# Patient Record
Sex: Male | Born: 1980 | Race: White | Hispanic: No | Marital: Single | Smoking: Current every day smoker
Health system: Southern US, Community
[De-identification: ages and names within clinical notes are randomized; demographics above are authoritative.]

## PROBLEM LIST (undated history)

## (undated) DIAGNOSIS — J45909 Unspecified asthma, uncomplicated: Secondary | ICD-10-CM

## (undated) DIAGNOSIS — C679 Malignant neoplasm of bladder, unspecified: Secondary | ICD-10-CM

## (undated) HISTORY — PX: HIP SURGERY: SHX245

---

## 2008-08-25 ENCOUNTER — Inpatient Hospital Stay (HOSPITAL_COMMUNITY): Admission: EM | Admit: 2008-08-25 | Discharge: 2008-09-08 | Payer: Self-pay | Admitting: Emergency Medicine

## 2008-08-31 ENCOUNTER — Ambulatory Visit: Payer: Self-pay | Admitting: Vascular Surgery

## 2008-08-31 ENCOUNTER — Encounter (INDEPENDENT_AMBULATORY_CARE_PROVIDER_SITE_OTHER): Payer: Self-pay | Admitting: General Surgery

## 2008-09-01 ENCOUNTER — Ambulatory Visit: Payer: Self-pay | Admitting: Physical Medicine & Rehabilitation
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
## 2011-09-01 LAB — CROSSMATCH: Antibody Screen: NEGATIVE

## 2011-09-01 LAB — POCT I-STAT 7, (LYTES, BLD GAS, ICA,H+H)
HCT: 22 — ABNORMAL LOW
Hemoglobin: 7.5 — CL
Patient temperature: 37
Potassium: 3.4 — ABNORMAL LOW
TCO2: 34
pO2, Arterial: 130 — ABNORMAL HIGH

## 2011-09-01 LAB — BASIC METABOLIC PANEL
BUN: 4 — ABNORMAL LOW
BUN: 4 — ABNORMAL LOW
BUN: 5 — ABNORMAL LOW
CO2: 30
CO2: 32
Calcium: 7.4 — ABNORMAL LOW
Calcium: 7.6 — ABNORMAL LOW
Calcium: 8.2 — ABNORMAL LOW
Chloride: 93 — ABNORMAL LOW
Creatinine, Ser: 0.71
Creatinine, Ser: 0.87
GFR calc non Af Amer: >60
GFR calc non Af Amer: >60
GFR calc non Af Amer: >60
Glucose, Bld: 105 — ABNORMAL HIGH
Glucose, Bld: 110 — ABNORMAL HIGH
Glucose, Bld: 127 — ABNORMAL HIGH
Glucose, Bld: 144 — ABNORMAL HIGH
Potassium: 3.3 — ABNORMAL LOW
Potassium: 4.2
Sodium: 129 — ABNORMAL LOW
Sodium: 135

## 2011-09-01 LAB — CBC
HCT: 24 — ABNORMAL LOW
HCT: 26.6 — ABNORMAL LOW
HCT: 26.7 — ABNORMAL LOW
HCT: 26.7 — ABNORMAL LOW
HCT: 39.2
Hemoglobin: 13.1
Hemoglobin: 8.2 — ABNORMAL LOW
Hemoglobin: 9.1 — ABNORMAL LOW
Hemoglobin: 9.2 — ABNORMAL LOW
Hemoglobin: 9.3 — ABNORMAL LOW
MCHC: 34.3
MCHC: 34.3
MCHC: 35
MCV: 92.5
MCV: 92.5
Platelets: 196
Platelets: 197
Platelets: 216
Platelets: 279
RBC: 2.83 — ABNORMAL LOW
RBC: 2.85 — ABNORMAL LOW
RBC: 2.89 — ABNORMAL LOW
RDW: 12.9
RDW: 13.2
RDW: 13.3
RDW: 13.9
RDW: 14
RDW: 14.1
WBC: 17.1 — ABNORMAL HIGH
WBC: 6.4
WBC: 8
WBC: 8.8

## 2011-09-01 LAB — POCT I-STAT 3, ART BLOOD GAS (G3+)
Acid-Base Excess: 11 — ABNORMAL HIGH
Bicarbonate: 33.6 — ABNORMAL HIGH
O2 Saturation: 91
O2 Saturation: 96
Patient temperature: 98.4
Patient temperature: 98.4
Patient temperature: 99.8
TCO2: 33
pCO2 arterial: 33.5 — ABNORMAL LOW
pCO2 arterial: 34.6 — ABNORMAL LOW
pCO2 arterial: 35
pCO2 arterial: 35.1
pCO2 arterial: 35.6
pH, Arterial: 7.577 — ABNORMAL HIGH
pH, Arterial: 7.579 — ABNORMAL HIGH
pH, Arterial: 7.58 — ABNORMAL HIGH
pH, Arterial: 7.585 — ABNORMAL HIGH
pH, Arterial: 7.605

## 2011-09-01 LAB — POCT I-STAT EG7
Acid-Base Excess: 7 — ABNORMAL HIGH
Bicarbonate: 33.8 — ABNORMAL HIGH
Calcium, Ion: 1.07 — ABNORMAL LOW
HCT: 31 — ABNORMAL LOW
Hemoglobin: 10.5 — ABNORMAL LOW
Patient temperature: 37
pCO2, Ven: 59.5 — ABNORMAL HIGH
pH, Ven: 7.362 — ABNORMAL HIGH
pO2, Ven: 19 — CL

## 2011-09-01 LAB — URINALYSIS, ROUTINE W REFLEX MICROSCOPIC
Glucose, UA: NEGATIVE
Leukocytes, UA: NEGATIVE
Nitrite: NEGATIVE
Protein, ur: 100 — AB

## 2011-09-01 LAB — TYPE AND SCREEN

## 2011-09-01 LAB — RAPID URINE DRUG SCREEN, HOSP PERFORMED
Amphetamines: NOT DETECTED
Barbiturates: NOT DETECTED
Benzodiazepines: NOT DETECTED
Cocaine: POSITIVE — AB
Opiates: NOT DETECTED
Tetrahydrocannabinol: NOT DETECTED

## 2011-09-01 LAB — POCT I-STAT, CHEM 8
BUN: 5 — ABNORMAL LOW
Creatinine, Ser: 1
Hemoglobin: 13.6
Potassium: 2.9 — ABNORMAL LOW
Sodium: 137

## 2011-09-01 LAB — URINE MICROSCOPIC-ADD ON

## 2011-09-01 LAB — ABO/RH: ABO/RH(D): A POS

## 2011-09-01 LAB — HEMOGLOBIN A1C
Hgb A1c MFr Bld: 5.3
Mean Plasma Glucose: 105

## 2011-09-01 LAB — GLUCOSE, CAPILLARY: Glucose-Capillary: 109 — ABNORMAL HIGH

## 2011-09-01 LAB — PROTIME-INR: INR: 1.1

## 2011-09-01 LAB — PREPARE RBC (CROSSMATCH)

## 2011-09-01 LAB — ETHANOL: Alcohol, Ethyl (B): 146 — ABNORMAL HIGH

## 2011-09-02 LAB — TRIGLYCERIDES
Triglycerides: 131
Triglycerides: 140

## 2011-09-02 LAB — BASIC METABOLIC PANEL
BUN: 7
Calcium: 8.3 — ABNORMAL LOW
Chloride: 98
Creatinine, Ser: 0.64
GFR calc non Af Amer: >60

## 2011-09-02 LAB — COMPREHENSIVE METABOLIC PANEL
ALT: 32
AST: 36
Albumin: 1.9 — ABNORMAL LOW
Alkaline Phosphatase: 57
CO2: 29
Calcium: 7.2 — ABNORMAL LOW
Chloride: 94 — ABNORMAL LOW
Chloride: 96
Creatinine, Ser: 0.7
GFR calc Af Amer: >60
GFR calc non Af Amer: >60
Glucose, Bld: 90
Potassium: 3.6
Sodium: 131 — ABNORMAL LOW
Total Bilirubin: 1.4 — ABNORMAL HIGH
Total Bilirubin: 1.6 — ABNORMAL HIGH
Total Protein: 4.7 — ABNORMAL LOW

## 2011-09-02 LAB — CULTURE, RESPIRATORY W GRAM STAIN: Culture: NORMAL

## 2011-09-02 LAB — GLUCOSE, CAPILLARY
Glucose-Capillary: 108 — ABNORMAL HIGH
Glucose-Capillary: 116 — ABNORMAL HIGH
Glucose-Capillary: 90
Glucose-Capillary: 92
Glucose-Capillary: 94

## 2011-09-02 LAB — DIFFERENTIAL
Basophils Absolute: 0
Basophils Relative: 0
Eosinophils Absolute: 0.3
Eosinophils Relative: 5
Monocytes Absolute: 0.6
Monocytes Relative: 10

## 2011-09-02 LAB — CULTURE, BLOOD (ROUTINE X 2): Culture: NO GROWTH

## 2011-09-02 LAB — CBC
HCT: 25.8 — ABNORMAL LOW
HCT: 26.5 — ABNORMAL LOW
Hemoglobin: 8.9 — ABNORMAL LOW
Hemoglobin: 9 — ABNORMAL LOW
MCHC: 34.5
MCV: 93.8
Platelets: 281
RBC: 2.74 — ABNORMAL LOW
RDW: 14
WBC: 5.7
WBC: 7.1

## 2011-09-02 LAB — POTASSIUM: Potassium: 5.2 — ABNORMAL HIGH

## 2011-09-02 LAB — URINE CULTURE
Colony Count: NO GROWTH
Culture: NO GROWTH

## 2011-09-02 LAB — PROTIME-INR: Prothrombin Time: 16.1 — ABNORMAL HIGH

## 2016-06-29 ENCOUNTER — Encounter (HOSPITAL_COMMUNITY): Payer: Self-pay | Admitting: General Practice

## 2016-06-29 ENCOUNTER — Inpatient Hospital Stay (HOSPITAL_COMMUNITY)
Admission: AD | Admit: 2016-06-29 | Discharge: 2016-07-01 | DRG: 885 | Disposition: A | Discharge status: Home / Self Care | Payer: Federal, State, Local not specified - Other | Attending: Psychiatry | Admitting: Psychiatry

## 2016-06-29 DIAGNOSIS — F332 Major depressive disorder, recurrent severe without psychotic features: Secondary | ICD-10-CM

## 2016-06-29 DIAGNOSIS — F339 Major depressive disorder, recurrent, unspecified: Principal | ICD-10-CM | POA: Diagnosis present

## 2016-06-29 DIAGNOSIS — J45909 Unspecified asthma, uncomplicated: Secondary | ICD-10-CM | POA: Diagnosis present

## 2016-06-29 DIAGNOSIS — G4700 Insomnia, unspecified: Secondary | ICD-10-CM | POA: Diagnosis present

## 2016-06-29 DIAGNOSIS — F411 Generalized anxiety disorder: Secondary | ICD-10-CM | POA: Diagnosis present

## 2016-06-29 DIAGNOSIS — F1410 Cocaine abuse, uncomplicated: Secondary | ICD-10-CM | POA: Diagnosis present

## 2016-06-29 DIAGNOSIS — F17210 Nicotine dependence, cigarettes, uncomplicated: Secondary | ICD-10-CM | POA: Diagnosis present

## 2016-06-29 DIAGNOSIS — F410 Panic disorder [episodic paroxysmal anxiety] without agoraphobia: Secondary | ICD-10-CM | POA: Diagnosis present

## 2016-06-29 DIAGNOSIS — F4310 Post-traumatic stress disorder, unspecified: Secondary | ICD-10-CM | POA: Diagnosis present

## 2016-06-29 DIAGNOSIS — F1010 Alcohol abuse, uncomplicated: Secondary | ICD-10-CM | POA: Diagnosis present

## 2016-06-29 DIAGNOSIS — R45851 Suicidal ideations: Secondary | ICD-10-CM | POA: Diagnosis present

## 2016-06-29 HISTORY — DX: Unspecified asthma, uncomplicated: J45.909

## 2016-06-29 MED ORDER — VITAMIN B-1 100 MG PO TABS
100.0000 mg | ORAL_TABLET | Freq: Every day | ORAL | Status: DC
  Administered 2016-06-30 – 2016-07-01 (×2): 100 mg via ORAL
  Filled 2016-06-29 (×4): qty 1

## 2016-06-29 MED ORDER — LORAZEPAM 1 MG PO TABS
1.0000 mg | ORAL_TABLET | Freq: Three times a day (TID) | ORAL | Status: DC
  Administered 2016-07-01 (×2): 1 mg via ORAL
  Filled 2016-06-29 (×2): qty 1

## 2016-06-29 MED ORDER — HYDROXYZINE HCL 25 MG PO TABS
25.0000 mg | ORAL_TABLET | Freq: Four times a day (QID) | ORAL | Status: DC | PRN
Start: 2016-06-29 — End: 2016-07-01

## 2016-06-29 MED ORDER — GABAPENTIN 600 MG PO TABS
300.0000 mg | ORAL_TABLET | Freq: Two times a day (BID) | ORAL | Status: DC
  Administered 2016-06-29: 300 mg via ORAL
  Filled 2016-06-29: qty 0.5
  Filled 2016-06-29: qty 1
  Filled 2016-06-29 (×3): qty 0.5

## 2016-06-29 MED ORDER — LOPERAMIDE HCL 2 MG PO CAPS: 2.0000 mg | ORAL_CAPSULE | ORAL | Status: DC | PRN

## 2016-06-29 MED ORDER — TRAZODONE HCL 50 MG PO TABS
50.0000 mg | ORAL_TABLET | Freq: Every evening | ORAL | Status: DC | PRN
  Administered 2016-06-29 – 2016-06-30 (×3): 50 mg via ORAL
  Filled 2016-06-29 (×7): qty 1

## 2016-06-29 MED ORDER — LORAZEPAM 1 MG PO TABS
1.0000 mg | ORAL_TABLET | Freq: Four times a day (QID) | ORAL | Status: DC | PRN
Start: 2016-06-29 — End: 2016-07-01

## 2016-06-29 MED ORDER — ENSURE ENLIVE PO LIQD
237.0000 mL | Freq: Three times a day (TID) | ORAL | Status: DC
  Administered 2016-06-29: 237 mL via ORAL

## 2016-06-29 MED ORDER — ACETAMINOPHEN 325 MG PO TABS: 650.0000 mg | ORAL_TABLET | Freq: Four times a day (QID) | ORAL | Status: DC | PRN

## 2016-06-29 MED ORDER — LORAZEPAM 1 MG PO TABS: 1.0000 mg | ORAL_TABLET | Freq: Two times a day (BID) | ORAL | Status: DC

## 2016-06-29 MED ORDER — ALUM & MAG HYDROXIDE-SIMETH 200-200-20 MG/5ML PO SUSP: 30.0000 mL | ORAL | Status: DC | PRN

## 2016-06-29 MED ORDER — MAGNESIUM HYDROXIDE 400 MG/5ML PO SUSP: 30.0000 mL | Freq: Every day | ORAL | Status: DC | PRN

## 2016-06-29 MED ORDER — LORAZEPAM 1 MG PO TABS: 1.0000 mg | ORAL_TABLET | Freq: Every day | ORAL | Status: DC

## 2016-06-29 MED ORDER — LORAZEPAM 1 MG PO TABS
1.0000 mg | ORAL_TABLET | Freq: Four times a day (QID) | ORAL | Status: AC
  Administered 2016-06-29 – 2016-06-30 (×6): 1 mg via ORAL
  Filled 2016-06-29 (×6): qty 1

## 2016-06-29 MED ORDER — ONDANSETRON 4 MG PO TBDP: 4.0000 mg | ORAL_TABLET | Freq: Four times a day (QID) | ORAL | Status: DC | PRN

## 2016-06-29 MED ORDER — NICOTINE 21 MG/24HR TD PT24
21.0000 mg | MEDICATED_PATCH | Freq: Every day | TRANSDERMAL | Status: DC
Start: 2016-06-29 — End: 2016-07-01
  Administered 2016-06-29 – 2016-07-01 (×3): 21 mg via TRANSDERMAL
  Filled 2016-06-29 (×4): qty 1

## 2016-06-29 MED ORDER — THIAMINE HCL 100 MG/ML IJ SOLN: 100.0000 mg | Freq: Once | INTRAMUSCULAR | Status: DC

## 2016-06-29 MED ORDER — ADULT MULTIVITAMIN W/MINERALS CH
1.0000 | ORAL_TABLET | Freq: Every day | ORAL | Status: DC
  Administered 2016-06-29 – 2016-07-01 (×3): 1 via ORAL
  Filled 2016-06-29 (×5): qty 1
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
## 2016-06-29 NOTE — Progress Notes (Signed)
Patient did attend the evening speaker AA meeting.  

## 2016-06-29 NOTE — Tx Team (Signed)
Initial Interdisciplinary Treatment Plan   PATIENT STRESSORS: Financial difficulties Loss of Mother in May Occupational concerns Substance abuse   PATIENT STRENGTHS: Average or above average intelligence General fund of knowledge   PROBLEM LIST: Problem List/Patient Goals Date to be addressed Date deferred Reason deferred Estimated date of resolution  Risk for Suicide 13-Jul-2016           ETOH Abuse Jul 13, 2016           "Attend groups" Jul 13, 2016           "Learning other strategies instead of drinking" 07/13/2016           Grief from mother's death 2016/07/13      DISCHARGE CRITERIA:  Improved stabilization in mood, thinking, and/or behavior Verbal commitment to aftercare and medication compliance Withdrawal symptoms are absent or subacute and managed without 24-hour nursing intervention  PRELIMINARY DISCHARGE PLAN: Attend 12-step recovery group Outpatient therapy  PATIENT/FAMIILY INVOLVEMENT: This treatment plan has been presented to and reviewed with the patient, Bobby Herman.  The patient and family have been given the opportunity to ask questions and make suggestions.  [name] 07/13/2016, 11:25 AM

## 2016-06-29 NOTE — BHH Group Notes (Signed)
[facility] Group Notes: (Clinical Social Work)   06/29/2016      Type of Therapy:  Group Therapy   Participation Level:  Did Not Attend    Ambrose Mantle, LCSW 06/29/2016, 1:52 PM
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
## 2016-06-30 MED ORDER — SERTRALINE HCL 25 MG PO TABS
25.0000 mg | ORAL_TABLET | Freq: Every day | ORAL | Status: DC
  Administered 2016-06-30 – 2016-07-01 (×2): 25 mg via ORAL
  Filled 2016-06-30 (×4): qty 1

## 2016-06-30 MED ORDER — TRIAMCINOLONE ACETONIDE 0.5 % EX CREA
TOPICAL_CREAM | Freq: Three times a day (TID) | CUTANEOUS | Status: DC
  Administered 2016-06-30 – 2016-07-01 (×3): via TOPICAL
  Filled 2016-06-30: qty 15

## 2016-06-30 MED ORDER — GABAPENTIN 300 MG PO CAPS
300.0000 mg | ORAL_CAPSULE | Freq: Two times a day (BID) | ORAL | Status: DC
  Administered 2016-06-30 – 2016-07-01 (×3): 300 mg via ORAL
  Filled 2016-06-30 (×5): qty 1

## 2016-06-30 NOTE — Progress Notes (Signed)
D: Patient pleasant and cooperative with care this shift. Pt with bright affect on approach and is noted to interact well with peers in the milieu. Pt participated in evening group session. A: Q 15 minute safety checks, encourage group participation and med compliance. R: Pt denies SI/HI or plans to harm himself.

## 2016-06-30 NOTE — Progress Notes (Signed)
Recreation Therapy Notes  Date: 06/30/16 Time: 0930 Location: 300 Hall Group Room  Group Topic: Stress Management  Goal Area(s) Addresses:  Patient will verbalize importance of using healthy stress management.  Patient will identify positive emotions associated with healthy stress management.    Intervention: Stress Management  Activity :  Healthy Boundaries Guided Imagery.  LRT introduced the technique of guided imagery to the patients.  Pt were to follow along as the LRT read the script to engaged in the activity.    Education:  Stress Management, Discharge Planning.    Clinical Observations/Feedback: Pt did not attend group.   Kacy Hegna, LRT/CTRS  

## 2016-06-30 NOTE — Progress Notes (Signed)
[facility] Group Notes:  (Nursing/MHT/Case Management/Adjunct)  Date:  06/30/2016  Time:  11:52 PM  Type of Therapy:  Psychoeducational Skills  Participation Level:  Active  Participation Quality:  Intrusive and Monopolizing  Affect:  Flat  Cognitive:  Appropriate  Insight:  Good  Engagement in Group:  Distracting  Modes of Intervention:  Education  Summary of Progress/Problems: The patient had to be redirected for talking out of turn but answered all of the questions. He indicated that he was simply grateful to be alive and that he had a good talk with his parents. In terms of the theme for the day, his wellness strategy will be to attend A.A. Meetings.   Hazle Coca S 06/30/2016, 11:52 PM

## 2016-06-30 NOTE — [facility] Group Notes (Signed)
[facility] LCSW Group Therapy  06/30/2016 4:34 PM  Type of Therapy:  Group Therapy  Participation Level:  Active  Participation Quality:  Attentive   Affect:  Appropriate  Cognitive:  Alert and Oriented  Insight:  Improving  Engagement in Therapy:  Improving  Modes of Intervention:  Discussion, Education, Exploration, Problem-solving, Rapport Building, Socialization and Support  Summary of Progress/Problems: Today's Topic: Overcoming Obstacles. Patients identified one short term goal and potential obstacles in reaching this goal. Patients processed barriers involved in overcoming these obstacles. Patients identified steps necessary for overcoming these obstacles and explored motivation (internal and external) for facing these difficulties head on. Bobby Herman was attentive and engaged during today's processing group. He talked about alcoholism and how it has negatively impacted his life from 5 DUI's to 3 years jail time. Patient reports that he is working on abstaining from drinking and plans to return home and attend AA. "I think I am gonna be more serious about taking my medications too, which is a first for me."   Smart, Jahanna Raether LCSW 06/30/2016, 4:34 PM

## 2016-06-30 NOTE — Progress Notes (Signed)
NUTRITION ASSESSMENT  Pt identified as at risk on the Malnutrition Screen Tool  INTERVENTION: 1. Supplements: Continue Ensure Enlive po BID, each supplement provides 350 kcal and 20 grams of protein  NUTRITION DIAGNOSIS: Unintentional weight loss related to sub-optimal intake as evidenced by pt report.   Goal: Pt to meet >/= 90% of their estimated nutrition needs.  Monitor:  PO intake  Assessment:  Pt admitted with depression, SI and substance abuse. Pt with history of cocaine and ETOH abuse. Suspect poor quality diet PTA d/t substance abuse. Pt reports 30 lb weight loss in the last 6 months, 14% weight loss x 6 months, significant for time frame. Pt has been ordered Ensure supplements, will continue.   Height: Ht Readings from Last 1 Encounters:  06/29/16 5\' 11"  (1.803 m)    Weight: Wt Readings from Last 1 Encounters:  06/29/16 178 lb (80.7 kg)    Weight Hx: Wt Readings from Last 10 Encounters:  06/29/16 178 lb (80.7 kg)    BMI:  Body mass index is 24.83 kg/m. Pt meets criteria for normal based on current BMI.  Estimated Nutritional Needs: Kcal: 25-30 kcal/kg Protein: > 1 gram protein/kg Fluid: 1 ml/kcal  Diet Order: Diet regular Room service appropriate? Yes; Fluid consistency: Thin Pt is also offered choice of unit snacks mid-morning and mid-afternoon.  Pt is eating as desired.   Lab results and medications reviewed.   Tilda Franco, MS, RD, LDN Pager: [phone] After Hours Pager: [phone]
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
## 2016-06-30 NOTE — Plan of Care (Signed)
Problem: Coping: Goal: Ability to cope will improve Outcome: Progressing Pt interacting well with peers and participated in evening AA session. Pt complaint with HS medications.

## 2016-06-30 NOTE — Progress Notes (Signed)
D: Patient up and visible in the milieu. Spoke with patient 1:1. Rates sleep fair, appetite and concentration as good, energy as normal. Patient's affect is animated, mood anxious. Rating his depression at a 6/10, hopelessness at a 3/10 and anxiety at a 4/10. Patient appears to be minimizing events stating, "I was drunk but I'm fine now. I need to get back to work. 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   A: Medicated per orders, no prns requested or required. Educated patient on the effects of alcohol intake, especially to the extent to which he drinks, on the body. Explained ativan protocol and what he can expect going forward. Also offered Vol Consent for Treatment form per Dr. Elna Breslow which patient signed. Emotional support offered and self inventory reviewed. High fall risk precautions reviewed and in place.   R: Patient verbalizes understanding, calmer after processing. Patient denies SI/HI and remains safe on level III obs.
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
## 2016-07-01 MED ORDER — TRIAMCINOLONE ACETONIDE 0.5 % EX CREA: TOPICAL_CREAM | Freq: Three times a day (TID) | CUTANEOUS | 0 refills | Status: AC

## 2016-07-01 MED ORDER — GABAPENTIN 300 MG PO CAPS: 300.0000 mg | ORAL_CAPSULE | Freq: Two times a day (BID) | ORAL | 0 refills | Status: AC

## 2016-07-01 MED ORDER — HYDROXYZINE HCL 25 MG PO TABS: ORAL_TABLET | ORAL | 0 refills | Status: AC

## 2016-07-01 MED ORDER — HYDROXYZINE HCL 25 MG PO TABS
25.0000 mg | ORAL_TABLET | Freq: Three times a day (TID) | ORAL | Status: DC
  Filled 2016-07-01 (×3): qty 21

## 2016-07-01 MED ORDER — NICOTINE 21 MG/24HR TD PT24: 21.0000 mg | MEDICATED_PATCH | Freq: Every day | TRANSDERMAL | 0 refills | Status: AC

## 2016-07-01 MED ORDER — TRAZODONE HCL 50 MG PO TABS: 50.0000 mg | ORAL_TABLET | Freq: Every evening | ORAL | 0 refills | Status: AC | PRN

## 2016-07-01 MED ORDER — SERTRALINE HCL 25 MG PO TABS
25.0000 mg | ORAL_TABLET | Freq: Every day | ORAL | 0 refills | Status: AC
Start: 2016-07-01 — End: ?

## 2016-07-01 NOTE — Progress Notes (Signed)
Pt has been up and visible in the milieu all evening.  He spent the evening in the dayroom talking to a male peer and watching TV.  He reports that he is doing well with his medications.  He states that his withdrawal symptoms are minimal.  He voices no needs or concerns at this time.  He wants to discharge soon as he says he needs to get back to work so that he does not lose his job.  Pt makes his needs known to staff.  He denies SI/HI/AVH.  Support and encouragement offered.  He is polite and cooperative.  Discharge plans are in process.  Safety maintained with q15 minute checks.

## 2016-07-01 NOTE — Progress Notes (Signed)
  [facility] Adult Case Management Discharge Plan :  Will you be returning to the same living situation after discharge:  Yes,  home with family At discharge, do you have transportation home?: Yes,  pt's sister coming at 1pm Do you have the ability to pay for your medications: Yes,  mental health  Release of information consent forms completed and submitted to medical records by CSW.  Patient to Follow up at: Follow-up Information    [facility] Follow up on 07/03/2016.   Why:  Appt on this date at 9:45AM for assessment of services (medication management/counseling/substance abuse). Please bring: Photo ID, Social Security Card if you have it, proof of income/paycheck stub. Thank you.  Contact information: [address] [place], [place] [address] Phone: [phone] Fax: [phone]        Comprehensive AA list for [place] and [place] counties also provided. Mental Health Association pamphlet provided--for additional community support/resources.   Next level of care provider has access to [facility] Link:no  Safety Planning and Suicide Prevention discussed: Yes,  SPE completed with pt's sister. SPI pamphlet and Mobile Crisis information provided to pt and he was encouraged to share with his support network.   Have you used any form of tobacco in the last 30 days? (Cigarettes, Smokeless Tobacco, Cigars, and/or Pipes): Yes  Has patient been referred to the Quitline?: Patient refused referral  Patient has been referred for addiction treatment: Yes  Smart, Kasee Hantz LCSW 07/01/2016, 11:04 AM

## 2016-07-01 NOTE — Progress Notes (Signed)
Adult Psychoeducational Group Note  Date:  07/01/2016 Time:  0845  Group Topic/Focus:  Orientation:   The focus of this group is to educate the patient on the purpose and policies of crisis stabilization and provide a format to answer questions about their admission.  The group details unit policies and expectations of patients while admitted.   Participation Level:  Active  Participation Quality:  Appropriate  Affect:  Appropriate  Cognitive:  Appropriate  Insight: Appropriate  Engagement in Group:  Engaged  Modes of Intervention:  Discussion and Orientation  Additional Comments:   Damara Klunder L 07/01/2016, 9:29 AM
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
## 2016-07-01 NOTE — Discharge Summary (Signed)
Physician Discharge Summary Note  Patient:  Bobby Herman is an 35 y.o., male MRN:  [id number] DOB:  Nov 05, 1981 Patient phone:  [phone] (home)  Patient address:   [address] [place] [address],  Total Time spent with patient: Greater than 30 minutes  Date of Admission:  06/29/2016 Date of Discharge: 07-01-16  Reason for Admission: Suicidal threats.  Principal Problem: Major depressive disorder, recurrent episode [facility])  Discharge Diagnoses: Patient Active Problem List   Diagnosis Date Noted  . Alcohol abuse, continuous drinking behavior [F10.10] 06/29/2016  . Cocaine abuse, continuous use [F14.10] 06/29/2016  . Major depressive disorder, recurrent episode (HCC) [F33.9] 06/29/2016  . MDD (major depressive disorder), recurrent episode (HCC) [F33.9] 06/29/2016   Past Psychiatric History: Hx. Alcoholism, chronic, Cocaine dependence, MDD.  Past Medical History:  Past Medical History:  Diagnosis Date  . Asthma     Past Surgical History:  Procedure Laterality Date  . HIP SURGERY     Family History: History reviewed. No pertinent family history.  Family Psychiatric  History: See H&P  Social History:  History  Alcohol Use  . Yes    Comment: several 40 oz, "I don't know how much"     History  Drug Use  . Types: Marijuana    Social History   Social History  . Marital status: Single    Spouse name: N/A  . Number of children: N/A  . Years of education: N/A   Social History Main Topics  . Smoking status: Current Every Day Smoker    Packs/day: 1.00    Types: Cigarettes  . Smokeless tobacco: Never Used  . Alcohol use Yes     Comment: several 40 oz, "I don't know how much"  . Drug use:     Types: Marijuana  . Sexual activity: Yes    Birth control/ protection: None   Other Topics Concern  . None   Social History Narrative  . None   Hospital Course: Bobby Herman an 35 y.o.malepresenting to [facility] ED via law enforcement requesting Detox.  Upon arrival patient's ETOH level was at 27. Medical Documentation reports that patient made comments stating that he wants to drink himself "to death", patient has an increase in anxiety and unclear thinking. Patient reports that he has a long history of drinking since the age of 71, however reports that in the past year he has been drinking daily. Patient reports upon arrival to the hospital he was very upset and states that while he was under the influence of alcohol he stated that he wanted to "drink" himself to death.   [name] was admitted to the hospital with a BAL of 27 per toxicology tests results. He admits having been drinking a lot & it has worsened resulting in high anxiety levels & inability to think clearly. Reports indicated that he was making suicidal threats that he wanted to drink himself to death. He was here for alcohol detox & mood stabilization treatments. His detoxification treatment was achieved using Ativan detox regimen on a tapering dose format. He was enrolled in the group counseling sessions, AA/NA meetings being offered and held on this unit. He participated and learned coping skills. He tolerated his treatment regimen without any significant adverse effects and or reactions.  Besides the detoxification treatments, [name] was also medicated & discharged on; Trazodone 50 mg for insomnia, Gabapentin 300 mg for agitation, Sertraline 25 mg for depression, Hydroxyzine 25 mg prn for anxiety & Nicotine patch for smoking cessation. [name]  has completed detox treatment and his mood is stable. This is evidenced by his reports of improved mood and absence of substance withdrawal symptoms. He will resume psychiatric care and routine medication management as noted below. He was encouraged to join/attend AA/NA meetings being offered and held within his community to achieve & maintain maximum sobriety.   Upon discharge, Bobby Herman adamantly denies any suicidal, homicidal ideations, auditory, visual  hallucinations, delusional thoughts, paranoia & or substance withdrawal symptoms. He left [facility] with all personal belongings in no apparent distress. He received a 7 days worth supply samples of his [name] discharge medications provided by [facility] pharmacy. Transportation per sister.  Physical Findings: AIMS: Facial and Oral Movements Muscles of Facial Expression: None, normal Lips and Perioral Area: None, normal Jaw: None, normal Tongue: None, normal,Extremity Movements Upper (arms, wrists, hands, fingers): None, normal Lower (legs, knees, ankles, toes): None, normal, Trunk Movements Neck, shoulders, hips: None, normal, Overall Severity Severity of abnormal movements (highest score from questions above): None, normal Incapacitation due to abnormal movements: None, normal Patient's awareness of abnormal movements (rate only patient's report): No Awareness, Dental Status Current problems with teeth and/or dentures?: No Does patient usually wear dentures?: No  CIWA:  CIWA-Ar Total: 0 COWS:     Musculoskeletal: Strength & Muscle Tone: within normal limits Gait & Station: normal Patient leans: N/A  Psychiatric Specialty Exam: Physical Exam  Constitutional: He appears well-developed.  HENT:  Head: Normocephalic.  Eyes: Pupils are equal, round, and reactive to light.  Neck: Normal range of motion.  Cardiovascular: Normal rate.   Respiratory: Effort normal.  GI: Soft.  Genitourinary:  Genitourinary Comments: Denies any issues in this area  Musculoskeletal: Normal range of motion.  Neurological: He is alert.  Skin: Skin is warm.    Review of Systems  Constitutional: Negative.   HENT: Negative.   Eyes: Negative.   Respiratory: Negative.   Cardiovascular: Negative.   Gastrointestinal: Negative.   Genitourinary: Negative.   Musculoskeletal: Negative.   Skin: Negative.   Neurological: Negative.   Endo/Heme/Allergies: Negative.   Psychiatric/Behavioral: Positive for depression (Stable)  and substance abuse (Polysubstance dependence). Negative for hallucinations, memory loss and suicidal ideas. The patient has insomnia (Stable). The patient is not nervous/anxious.     Blood pressure 111/83, pulse (!) 106, temperature 99.5 F (37.5 C), temperature source Oral, resp. rate 16, height 5\' 11"  (1.803 m), weight 80.7 kg (178 lb), SpO2 99 %.Body mass index is 24.83 kg/m.  See Md's SRA   Have you used any form of tobacco in the last 30 days? (Cigarettes, Smokeless Tobacco, Cigars, and/or Pipes): Yes  Has this patient used any form of tobacco in the last 30 days? (Cigarettes, Smokeless Tobacco, Cigars, and/or Pipes): Yes, provided with nicotine patch prescription.  Blood Alcohol level:  Lab Results  Component Value Date   ETH (H) 08/25/2008    146        LOWEST DETECTABLE LIMIT FOR SERUM ALCOHOL IS 11 mg/dL FOR MEDICAL PURPOSES ONLY   Metabolic Disorder Labs:  Lab Results  Component Value Date   HGBA1C  08/30/2008    5.3 (NOTE)   The ADA recommends the following therapeutic goal for glycemic   control related to Hgb A1C measurement:   Goal of Therapy:   < 7.0% Hgb A1C   Reference: American Diabetes Association: Clinical Practice   Recommendations 2008, Diabetes Care,  2008, 31:(Suppl 1).   MPG 105 08/30/2008   No results found for: PROLACTIN Lab Results  Component Value Date  CHOL  08/30/2008    95        ATP III CLASSIFICATION:  <200     mg/dL   Desirable  962-952  mg/dL   Borderline High  >=841    mg/dL   High   TRIG 324 40/08/2724   See Psychiatric Specialty Exam and Suicide Risk Assessment completed by Attending Physician prior to discharge.  Discharge destination:  Home  Is patient on multiple antipsychotic therapies at discharge:  No   Has Patient had three or more failed trials of antipsychotic monotherapy by history:  No  Recommended Plan for Multiple Antipsychotic Therapies: NA    Medication List    TAKE these medications     Indication   gabapentin 300 MG capsule Commonly known as:  NEURONTIN Take 1 capsule (300 mg total) by mouth 2 (two) times daily. For agitation/substance withdrawal syndrome.  Indication:  Agitation, Alcohol Withdrawal Syndrome   hydrOXYzine 25 MG tablet Commonly known as:  ATARAX/VISTARIL Take 1 tablet (25 mg) three times daily: For anxiety  Indication:  Anxiety Neurosis   nicotine 21 mg/24hr patch Commonly known as:  NICODERM CQ - dosed in mg/24 hours Place 1 patch (21 mg total) onto the skin daily. For smoking cessation  Indication:  Nicotine Addiction   sertraline 25 MG tablet Commonly known as:  ZOLOFT Take 1 tablet (25 mg total) by mouth daily. For depression  Indication:  Major Depressive Disorder   traZODone 50 MG tablet Commonly known as:  DESYREL Take 1 tablet (50 mg total) by mouth at bedtime and may repeat dose one time if needed. For sleep  Indication:  Trouble Sleeping   triamcinolone cream 0.5 % Commonly known as:  KENALOG Apply topically 3 (three) times daily. For Psoraisis.  Indication:  Psoriasis      Follow-up Information    [facility] Follow up on 07/03/2016.   Why:  Appt on this date at 9:45AM for assessment of services (medication management/counseling/substance abuse). Please bring: Photo ID, Social Security Card if you have it, proof of income/paycheck stub. Thank you.  Contact information: [address] [place], [place] [address] Phone: [phone] Fax: [phone]         Follow-up recommendations: Activity:  As tolerated Diet: As recommended by your primary care doctor. Keep all scheduled follow-up appointments as recommended.   Comments: Patient is instructed prior to discharge to: Take all medications as prescribed by his/her mental healthcare provider. Report any adverse effects and or reactions from the medicines to his/her outpatient provider promptly. Patient has been instructed & cautioned: To not engage in alcohol and or illegal drug use  while on prescription medicines. In the event of worsening symptoms, patient is instructed to call the crisis hotline, 911 and or go to the nearest ED for appropriate evaluation and treatment of symptoms. To follow-up with his/her primary care provider for your other medical issues, concerns and or health care needs.   Signed: Sanjuana Kava, NP, PMHNP, FNP-BC 07/01/2016, 10:34 AM

## 2016-07-01 NOTE — [facility] Suicide Risk Assessment (Signed)
[facility] Discharge Suicide Risk Assessment   Principal Problem: Major depressive disorder, recurrent episode [facility]) Discharge Diagnoses:  Patient Active Problem List   Diagnosis Date Noted  . Alcohol abuse, continuous drinking behavior [F10.10] 06/29/2016  . Cocaine abuse, continuous use [F14.10] 06/29/2016  . Major depressive disorder, recurrent episode (HCC) [F33.9] 06/29/2016  . MDD (major depressive disorder), recurrent episode (HCC) [F33.9] 06/29/2016    Total Time spent with patient: 30 minutes  Musculoskeletal: Strength & Muscle Tone: within normal limits Gait & Station: normal Patient leans: N/A  Psychiatric Specialty Exam: Review of Systems  Psychiatric/Behavioral: Positive for substance abuse. Negative for depression and suicidal ideas.  All other systems reviewed and are negative.   Blood pressure 111/83, pulse (!) 106, temperature 99.5 F (37.5 C), temperature source Oral, resp. rate 16, height 5\' 11"  (1.803 m), weight 80.7 kg (178 lb), SpO2 99 %.Body mass index is 24.83 kg/m.  General Appearance: Casual  Eye Contact::  Fair  Speech:  Clear and Coherent409  Volume:  Normal  Mood:  Euthymic  Affect:  Appropriate  Thought Process:  Goal Directed and Descriptions of Associations: Intact  Orientation:  Full (Time, Place, and Person)  Thought Content:  WDL  Suicidal Thoughts:  No  Homicidal Thoughts:  No  Memory:  Immediate;   Fair Recent;   Fair Remote;   Fair  Judgement:  Fair  Insight:  Fair  Psychomotor Activity:  Normal  Concentration:  Fair  Recall:  [employer] of Knowledge:Fair  Language: Fair  Akathisia:  No  Handed:  Right  AIMS (if indicated):     Assets:  Desire for Improvement Social Support  Sleep:  Number of Hours: 6.75  Cognition: WNL  ADL's:  Intact   Mental Status Per Nursing Assessment::   On Admission:  Suicidal ideation indicated by patient  Demographic Factors:  Male and Caucasian  Loss Factors: NA  Historical  Factors: Impulsivity  Risk Reduction Factors:   Living with another person, especially a relative, Positive social support and Positive therapeutic relationship  Continued Clinical Symptoms:  Alcohol/Substance Abuse/Dependencies  Cognitive Features That Contribute To Risk:  None    Suicide Risk:  Minimal: No identifiable suicidal ideation.  Patients presenting with no risk factors but with morbid ruminations; may be classified as minimal risk based on the severity of the depressive symptoms  Follow-up Information    [facility] Follow up on 07/03/2016.   Why:  Appt on this date at 9:45AM for assessment of services (medication management/counseling/substance abuse). Please bring: Photo ID, Social Security Card if you have it, proof of income/paycheck stub. Thank you.  Contact information: [address] [place], [place] [address] Phone: [phone] Fax: [phone]          Plan Of Care/Follow-up recommendations:  Activity:  no restrictions Diet:  regular Tests:  as needed Other:  follow up with aftercare  Pauline Trainer, MD 07/01/2016, 9:45 AM

## 2016-07-01 NOTE — Progress Notes (Signed)
Patient discharged per physician order; patient denies SI/HI and A/V hallucinations; patient received prescriptions, samples, AVS, transition record, suicide risk assessment note, and transition record given to the patient after it was reviewed; patient had no other questions or concerns at this time; patient verbalized and signed that all belongings were returned; patient left the unit ambulatory

## 2016-07-01 NOTE — [facility] Suicide Risk Assessment (Signed)
[facility] INPATIENT:  Family/Significant Other Suicide Prevention Education  Suicide Prevention Education:  Education Completed; Aldona Bar (pt's sister) [phone] has been identified by the patient as the family member/significant other with whom the patient will be residing, and identified as the person(s) who will aid the patient in the event of a mental health crisis (suicidal ideations/suicide attempt).  With written consent from the patient, the family member/significant other has been provided the following suicide prevention education, prior to the and/or following the discharge of the patient.  The suicide prevention education provided includes the following:  Suicide risk factors  Suicide prevention and interventions  National Suicide Hotline telephone number  [facility] assessment telephone number  [facility] Emergency Assistance 911  [facility] and/or Residential Mobile Crisis Unit telephone number  Request made of family/significant other to:  Remove weapons (e.g., guns, rifles, knives), all items previously/currently identified as safety concern.    Remove drugs/medications (over-the-counter, prescriptions, illicit drugs), all items previously/currently identified as a safety concern.  The family member/significant other verbalizes understanding of the suicide prevention education information provided.  The family member/significant other agrees to remove the items of safety concern listed above.  Smart, Joaquina Nissen LCSW 07/01/2016, 9:33 AM

## 2021-08-12 ENCOUNTER — Encounter (HOSPITAL_COMMUNITY): Payer: Self-pay | Admitting: Emergency Medicine

## 2021-08-12 ENCOUNTER — Emergency Department (HOSPITAL_COMMUNITY): Admission: EM | Admit: 2021-08-12 | Discharge: 2021-08-12 | Discharge status: Against Medical Advice | Payer: Self-pay

## 2021-08-12 ENCOUNTER — Other Ambulatory Visit: Payer: Self-pay

## 2021-08-12 NOTE — ED Triage Notes (Signed)
Patient BIBA from hotel d/t ETOH use. Patient denies any alcohol use today. Hx alcohol and cocaine abuse. VS WDL.

## 2023-01-17 DIAGNOSIS — S161XXA Strain of muscle, fascia and tendon at neck level, initial encounter: Secondary | ICD-10-CM | POA: Diagnosis not present

## 2023-01-17 DIAGNOSIS — R519 Headache, unspecified: Secondary | ICD-10-CM | POA: Diagnosis not present

## 2023-01-17 DIAGNOSIS — S39012A Strain of muscle, fascia and tendon of lower back, initial encounter: Secondary | ICD-10-CM | POA: Diagnosis not present

## 2023-01-17 DIAGNOSIS — M542 Cervicalgia: Secondary | ICD-10-CM | POA: Diagnosis not present

## 2023-08-15 ENCOUNTER — Emergency Department (HOSPITAL_COMMUNITY): Payer: MEDICAID

## 2023-08-15 ENCOUNTER — Emergency Department (HOSPITAL_COMMUNITY)
Admission: EM | Admit: 2023-08-15 | Discharge: 2023-08-15 | Disposition: A | Discharge status: Home / Self Care | Payer: MEDICAID | Attending: Emergency Medicine | Admitting: Emergency Medicine

## 2023-08-15 ENCOUNTER — Encounter (HOSPITAL_COMMUNITY): Payer: Self-pay | Admitting: Emergency Medicine

## 2023-08-15 DIAGNOSIS — S0990XA Unspecified injury of head, initial encounter: Secondary | ICD-10-CM | POA: Diagnosis present

## 2023-08-15 DIAGNOSIS — S1093XA Contusion of unspecified part of neck, initial encounter: Secondary | ICD-10-CM | POA: Diagnosis not present

## 2023-08-15 DIAGNOSIS — S299XXA Unspecified injury of thorax, initial encounter: Secondary | ICD-10-CM | POA: Diagnosis not present

## 2023-08-15 DIAGNOSIS — Z79899 Other long term (current) drug therapy: Secondary | ICD-10-CM | POA: Diagnosis not present

## 2023-08-15 DIAGNOSIS — F1520 Other stimulant dependence, uncomplicated: Secondary | ICD-10-CM | POA: Insufficient documentation

## 2023-08-15 DIAGNOSIS — S0083XA Contusion of other part of head, initial encounter: Secondary | ICD-10-CM | POA: Insufficient documentation

## 2023-08-15 DIAGNOSIS — F1410 Cocaine abuse, uncomplicated: Secondary | ICD-10-CM | POA: Diagnosis not present

## 2023-08-15 DIAGNOSIS — Z23 Encounter for immunization: Secondary | ICD-10-CM | POA: Insufficient documentation

## 2023-08-15 DIAGNOSIS — S0011XA Contusion of right eyelid and periocular area, initial encounter: Secondary | ICD-10-CM | POA: Diagnosis not present

## 2023-08-15 DIAGNOSIS — F1510 Other stimulant abuse, uncomplicated: Secondary | ICD-10-CM

## 2023-08-15 DIAGNOSIS — S298XXA Other specified injuries of thorax, initial encounter: Secondary | ICD-10-CM

## 2023-08-15 DIAGNOSIS — S3991XA Unspecified injury of abdomen, initial encounter: Secondary | ICD-10-CM | POA: Insufficient documentation

## 2023-08-15 HISTORY — DX: Malignant neoplasm of bladder, unspecified: C67.9

## 2023-08-15 LAB — URINALYSIS, ROUTINE W REFLEX MICROSCOPIC
Bilirubin Urine: NEGATIVE
Glucose, UA: NEGATIVE mg/dL
Hgb urine dipstick: NEGATIVE
Ketones, ur: NEGATIVE mg/dL
Leukocytes,Ua: NEGATIVE
Nitrite: NEGATIVE
Protein, ur: 30 mg/dL — AB
Specific Gravity, Urine: 1.015 (ref 1.005–1.030)
pH: 6 (ref 5.0–8.0)

## 2023-08-15 LAB — CK TOTAL AND CKMB (NOT AT ARMC)
CK, MB: 4.9 ng/mL (ref 0.5–5.0)
Total CK: 294 U/L (ref 49–397)

## 2023-08-15 LAB — SAMPLE TO BLOOD BANK

## 2023-08-15 LAB — RAPID URINE DRUG SCREEN, HOSP PERFORMED
Amphetamines: POSITIVE — AB
Barbiturates: NOT DETECTED
Benzodiazepines: NOT DETECTED
Cocaine: NOT DETECTED
Opiates: NOT DETECTED
Tetrahydrocannabinol: POSITIVE — AB

## 2023-08-15 LAB — URINALYSIS, MICROSCOPIC (REFLEX): Bacteria, UA: NONE SEEN

## 2023-08-15 LAB — COMPREHENSIVE METABOLIC PANEL
ALT: 187 U/L — ABNORMAL HIGH (ref 0–44)
AST: 94 U/L — ABNORMAL HIGH (ref 15–41)
Albumin: 3.4 g/dL — ABNORMAL LOW (ref 3.5–5.0)
Alkaline Phosphatase: 61 U/L (ref 38–126)
Anion gap: 10 (ref 5–15)
BUN: 9 mg/dL (ref 6–20)
CO2: 25 mmol/L (ref 22–32)
Calcium: 8.5 mg/dL — ABNORMAL LOW (ref 8.9–10.3)
Chloride: 103 mmol/L (ref 98–111)
Creatinine, Ser: 0.94 mg/dL (ref 0.61–1.24)
GFR, Estimated: >60 mL/min (ref >60)
Glucose, Bld: 95 mg/dL (ref 70–99)
Potassium: 3.6 mmol/L (ref 3.5–5.1)
Sodium: 138 mmol/L (ref 135–145)
Total Bilirubin: 1 mg/dL (ref 0.3–1.2)
Total Protein: 7.1 g/dL (ref 6.5–8.1)

## 2023-08-15 LAB — CBC
HCT: 40.7 % (ref 39.0–52.0)
Hemoglobin: 13.4 g/dL (ref 13.0–17.0)
MCH: 30 pg (ref 26.0–34.0)
MCHC: 32.9 g/dL (ref 30.0–36.0)
MCV: 91.1 fL (ref 80.0–100.0)
Platelets: 258 10*3/uL (ref 150–400)
RBC: 4.47 MIL/uL (ref 4.22–5.81)
RDW: 12.4 % (ref 11.5–15.5)
WBC: 9.8 10*3/uL (ref 4.0–10.5)
nRBC: 0 % (ref 0.0–0.2)

## 2023-08-15 LAB — ETHANOL: Alcohol, Ethyl (B): <10 mg/dL (ref <10)

## 2023-08-15 LAB — PROTIME-INR
INR: 1 (ref 0.8–1.2)
Prothrombin Time: 13.8 s (ref 11.4–15.2)

## 2023-08-15 MED ORDER — IOHEXOL 350 MG/ML SOLN
75.0000 mL | Freq: Once | INTRAVENOUS | Status: AC | PRN
  Administered 2023-08-15: 75 mL via INTRAVENOUS

## 2023-08-15 MED ORDER — ACETAMINOPHEN 500 MG PO TABS
1000.0000 mg | ORAL_TABLET | Freq: Once | ORAL | Status: AC
  Administered 2023-08-15: 1000 mg via ORAL
  Filled 2023-08-15: qty 2

## 2023-08-15 MED ORDER — SODIUM CHLORIDE 0.9 % IV BOLUS
125.0000 mL | Freq: Once | INTRAVENOUS | Status: AC
  Administered 2023-08-15: 125 mL via INTRAVENOUS

## 2023-08-15 MED ORDER — TETANUS-DIPHTH-ACELL PERTUSSIS 5-2.5-18.5 LF-MCG/0.5 IM SUSY
0.5000 mL | PREFILLED_SYRINGE | Freq: Once | INTRAMUSCULAR | Status: AC
  Administered 2023-08-15: 0.5 mL via INTRAMUSCULAR
  Filled 2023-08-15: qty 0.5
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
## 2023-08-15 NOTE — Discharge Instructions (Addendum)
1.  Sleep with your head elevated.  This will help with swelling.  Apply well wrapped ice pack or cold pack to areas of swelling especially on the face. 2.  Take Tylenol every 6 hours for pain.  Apply antibiotic ointment to any small areas of cuts or abrasions. 3.  Schedule follow-up appointment for recheck with your doctor. From today's labs, a few of your liver function tests were elevated (AST 94, ALT 187) - discuss with, and follow up with primary care doctor.  4. Avoid drug use as it is harmful to your physical health and mental well-being - follow up with substance use treatment program this week, as well as other counseling services - see resources attached.  5. For mental health issues and/or crisis, you may also go directly to the [facility] - it is open 24/7 and walk-ins are welcome.  5.  Return to the emergency department if you have new or worsening symptoms.  Reviewed your discharge instructions for return precautions.

## 2023-08-15 NOTE — ED Notes (Signed)
Patient transported to CT 

## 2023-08-15 NOTE — ED Notes (Signed)
Pt given sandwich

## 2023-08-15 NOTE — ED Provider Notes (Signed)
Signed out to d/c to home when imaging studies resulted.   CTs negative for acute fracture, contusion/sts noted.   Pt is alert, oriented. GCS 15. C spine non tender, aligned. Perrl, eomi, no hyphema. Wounds/abrasions cleaned, bacitracin and dressings applied. Icepack to sore area. Tetanus IM. Po fluids/food. Ambulate in hall.   Pt currently appears stable for d/c.      Cathren Laine, MD 08/15/23 1710

## 2023-09-16 ENCOUNTER — Telehealth: Payer: Self-pay

## 2023-09-16 NOTE — Telephone Encounter (Signed)
Transition Care Management Unsuccessful Follow-up Telephone Call  Date of discharge and from where:  08/15/2023 [facility]  Attempts:  1st Attempt  Reason for unsuccessful TCM follow-up call:  Missing or invalid number  [facility]  [facility], [facility] Resource Care Guide Direct Dial: [phone]  Website: [facility] Herman
